# Patient Record
Sex: Male | Born: 1980 | Race: Black or African American | Hispanic: No | Marital: Single | Smoking: Never smoker
Health system: Southern US, Community
[De-identification: ages and names within clinical notes are randomized; demographics above are authoritative.]

## PROBLEM LIST (undated history)

## (undated) DIAGNOSIS — E119 Type 2 diabetes mellitus without complications: Secondary | ICD-10-CM

## (undated) HISTORY — DX: Type 2 diabetes mellitus without complications: E11.9

---

## 2014-06-16 ENCOUNTER — Emergency Department (HOSPITAL_COMMUNITY)
Admission: EM | Admit: 2014-06-16 | Discharge: 2014-06-16 | Disposition: A | Discharge status: Home / Self Care | Payer: Medicaid Other | Attending: Emergency Medicine | Admitting: Emergency Medicine

## 2014-06-16 ENCOUNTER — Encounter (HOSPITAL_COMMUNITY): Payer: Self-pay | Admitting: Emergency Medicine

## 2014-06-16 DIAGNOSIS — M545 Low back pain: Secondary | ICD-10-CM | POA: Diagnosis present

## 2014-06-16 DIAGNOSIS — M5442 Lumbago with sciatica, left side: Secondary | ICD-10-CM

## 2014-06-16 MED ORDER — CYCLOBENZAPRINE HCL 10 MG PO TABS: 10.0000 mg | ORAL_TABLET | Freq: Every day | ORAL | Status: DC

## 2014-06-16 MED ORDER — IBUPROFEN 800 MG PO TABS: 800.0000 mg | ORAL_TABLET | Freq: Three times a day (TID) | ORAL | Status: DC

## 2014-06-16 NOTE — ED Notes (Signed)
Pt c/o lower back pain with radiation down both legs, worse in left x1 week. Denies trauma. Steady gait

## 2014-06-16 NOTE — ED Provider Notes (Signed)
CSN: [id number]     Arrival date & time 06/16/14  1930 History  This chart was scribed for Elpidio AnisShari Jazzlin Clements, PA-C with Purvis SheffieldForrest Harrison, MD by Tonye RoyaltyJoshua Chen, ED Scribe. This patient was seen in room WTR8/WTR8 and the patient's care was started at 8:26 PM.    Chief Complaint  Patient presents with  . Back Pain   The history is provided by the patient. No language interpreter was used.    HPI Comments: Steve Skinner is a 33 y.o. male who presents to the Emergency Department complaining of low back pain radiating to both legs, worse on the left, with onset 1 week ago. He denies particular exertion or injury, but notes he does play sports. He notes similar pain 1 year ago that resolved after exercises. He denies chronic medical problems. He states he has not taken any medication for his symptoms but has tried yoga which improved his pain. He denies incontinence, weakness in legs, or abdominal pain.  History reviewed. No pertinent past medical history. History reviewed. No pertinent past surgical history. No family history on file. History  Substance Use Topics  . Smoking status: Never Smoker   . Smokeless tobacco: Not on file  . Alcohol Use: Yes     Comment: occ    Review of Systems  Constitutional: Negative for fever.  Gastrointestinal: Negative for abdominal pain.  Genitourinary: Negative for enuresis.  Musculoskeletal: Positive for back pain.  Neurological: Negative for weakness and numbness.       Denies incontinence      Allergies  Review of patient's allergies indicates no known allergies.  Home Medications   Prior to Admission medications   Not on File   BP 145/87 mmHg  Pulse 75  Temp(Src) 98.1 F (36.7 C) (Oral)  Resp 16  Ht 5\' 11"  (1.803 m)  Wt 170 lb (77.111 kg)  BMI 23.72 kg/m2  SpO2 97% Physical Exam  Constitutional: He is oriented to person, place, and time. He appears well-developed and well-nourished.  HENT:  Head: Normocephalic and atraumatic.  Eyes:  Conjunctivae are normal.  Cardiovascular:  Distal pulses present in lower extremities  Pulmonary/Chest: Effort normal.  Musculoskeletal:  No reproducable lumbar back pain, no swelling Lower extrmities have full and equal strength, no swelling  Neurological: He is alert and oriented to person, place, and time.  Reflexes equal in lower extremities  Psychiatric: He has a normal mood and affect.  Nursing note and vitals reviewed.   ED Course  Procedures (including critical care time)  DIAGNOSTIC STUDIES: Oxygen Saturation is 97% on room air, normal by my interpretation.    COORDINATION OF CARE: 8:31 PM Discussed treatment plan with patient at beside, the patient agrees with the plan and has no further questions at this time.   Labs Review Labs Reviewed - No data to display  Imaging Review No results found.   EKG Interpretation None      MDM   Final diagnoses:  None    1. Low back pain  No neurologic deficits on exam. Well appearing patient with low back pain that is improving. Supportive care.  I personally performed the services described in this documentation, which was scribed in my presence. The recorded information has been reviewed and is accurate.    Arnoldo HookerShari A Annastasia Haskins, PA-C 06/16/14 2042  Purvis SheffieldForrest Harrison, MD 06/17/14 [phone]

## 2014-06-16 NOTE — Discharge Instructions (Signed)
Back Pain, Adult Back pain is very common. The pain often gets better over time. The cause of back pain is usually not dangerous. Most people can learn to manage their back pain on their own.  HOME CARE   Stay active. Start with short walks on flat ground if you can. Try to walk farther each day.  Do not sit, drive, or stand in one place for more than 30 minutes. Do not stay in bed.  Do not avoid exercise or work. Activity can help your back heal faster.  Be careful when you bend or lift an object. Bend at your knees, keep the object close to you, and do not twist.  Sleep on a firm mattress. Lie on your side, and bend your knees. If you lie on your back, put a pillow under your knees.  Only take medicines as told by your doctor.  Put ice on the injured area.  Put ice in a plastic bag.  Place a towel between your skin and the bag.  Leave the ice on for 15-20 minutes, 03-04 times a day for the first 2 to 3 days. After that, you can switch between ice and heat packs.  Ask your doctor about back exercises or massage.  Avoid feeling anxious or stressed. Find good ways to deal with stress, such as exercise. GET HELP RIGHT AWAY IF:   Your pain does not go away with rest or medicine.  Your pain does not go away in 1 week.  You have new problems.  You do not feel well.  The pain spreads into your legs.  You cannot control when you poop (bowel movement) or pee (urinate).  Your arms or legs feel weak or lose feeling (numbness).  You feel sick to your stomach (nauseous) or throw up (vomit).  You have belly (abdominal) pain.  You feel like you may pass out (faint). MAKE SURE YOU:   Understand these instructions.  Will watch your condition.  Will get help right away if you are not doing well or get worse. Document Released: 12/03/2007 Document Revised: 09/08/2011 Document Reviewed: 10/18/2013 [facility] Patient Information 2015 [place]Care, [place]. This information is not intended  to replace advice given to you by your health care provider. Make sure you discuss any questions you have with your health care provider. Cryotherapy Cryotherapy means treatment with cold. Ice or gel packs can be used to reduce both pain and swelling. Ice is the most helpful within the first 24 to 48 hours after an injury or flare-up from overusing a muscle or joint. Sprains, strains, spasms, burning pain, shooting pain, and aches can all be eased with ice. Ice can also be used when recovering from surgery. Ice is effective, has very few side effects, and is safe for most people to use. PRECAUTIONS  Ice is not a safe treatment option for people with:  Raynaud phenomenon. This is a condition affecting small blood vessels in the extremities. Exposure to cold may cause your problems to return.  Cold hypersensitivity. There are many forms of cold hypersensitivity, including:  Cold urticaria. Red, itchy hives appear on the skin when the tissues begin to warm after being iced.  Cold erythema. This is a red, itchy rash caused by exposure to cold.  Cold hemoglobinuria. Red blood cells break down when the tissues begin to warm after being iced. The hemoglobin that carry oxygen are passed into the urine because they cannot combine with blood proteins fast enough.  Numbness or altered sensitivity  in the area being iced. °If you have any of the following conditions, do not use ice until you have discussed cryotherapy with your caregiver: °· Heart conditions, such as arrhythmia, angina, or chronic heart disease. °· High blood pressure. °· Healing wounds or open skin in the area being iced. °· Current infections. °· Rheumatoid arthritis. °· Poor circulation. °· Diabetes. °Ice slows the blood flow in the region it is applied. This is beneficial when trying to stop inflamed tissues from spreading irritating chemicals to surrounding tissues. However, if you expose your skin to cold temperatures for too long or  without the proper protection, you can damage your skin or nerves. Watch for signs of skin damage due to cold. °HOME CARE INSTRUCTIONS °Follow these tips to use ice and cold packs safely. °· Place a dry or damp towel between the ice and skin. A damp towel will cool the skin more quickly, so you may need to shorten the time that the ice is used. °· For a more rapid response, add gentle compression to the ice. °· Ice for no more than 10 to 20 minutes at a time. The bonier the area you are icing, the less time it will take to get the benefits of ice. °· Check your skin after 5 minutes to make sure there are no signs of a poor response to cold or skin damage. °· Rest 20 minutes or more between uses. °· Once your skin is numb, you can end your treatment. You can test numbness by very lightly touching your skin. The touch should be so light that you do not see the skin dimple from the pressure of your fingertip. When using ice, most people will feel these normal sensations in this order: cold, burning, aching, and numbness. °· Do not use ice on someone who cannot communicate their responses to pain, such as small children or people with dementia. °HOW TO MAKE AN ICE PACK °Ice packs are the most common way to use ice therapy. Other methods include ice massage, ice baths, and cryosprays. Muscle creams that cause a cold, tingly feeling do not offer the same benefits that ice offers and should not be used as a substitute unless recommended by your caregiver. °To make an ice pack, do one of the following: °· Place crushed ice or a bag of frozen vegetables in a sealable plastic bag. Squeeze out the excess air. Place this bag inside another plastic bag. Slide the bag into a pillowcase or place a damp towel between your skin and the bag. °· Mix 3 parts water with 1 part rubbing alcohol. Freeze the mixture in a sealable plastic bag. When you remove the mixture from the freezer, it will be slushy. Squeeze out the excess air. Place  this bag inside another plastic bag. Slide the bag into a pillowcase or place a damp towel between your skin and the bag. °SEEK MEDICAL CARE IF: °· You develop white spots on your skin. This may give the skin a blotchy (mottled) appearance. °· Your skin turns blue or pale. °· Your skin becomes waxy or hard. °· Your swelling gets worse. °MAKE SURE YOU:  °· Understand these instructions. °· Will watch your condition. °· Will get help right away if you are not doing well or get worse. °Document Released: 02/10/2011 Document Revised: 10/31/2013 Document Reviewed: 02/10/2011 °ExitCare® Patient Information ©2015 [employer], LLC. This information is not intended to replace advice given to you by your health care provider. Make sure you discuss any   questions you have with your health care provider. Heat Therapy Heat therapy can help ease sore, stiff, injured, and tight muscles and joints. Heat relaxes your muscles, which may help ease your pain.  RISKS AND COMPLICATIONS If you have any of the following conditions, do not use heat therapy unless your health care provider has approved:  Poor circulation.  Healing wounds or scarred skin in the area being treated.  Diabetes, heart disease, or high blood pressure.  Not being able to feel (numbness) the area being treated.  Unusual swelling of the area being treated.  Active infections.  Blood clots.  Cancer.  Inability to communicate pain. This may include young children and people who have problems with their brain function (dementia).  Pregnancy. Heat therapy should only be used on old, pre-existing, or long-lasting (chronic) injuries. Do not use heat therapy on new injuries unless directed by your health care provider. HOW TO USE HEAT THERAPY There are several different kinds of heat therapy, including:  Moist heat pack.  Warm water bath.  Hot water bottle.  Electric heating pad.  Heated gel pack.  Heated wrap.  Electric heating  pad. Use the heat therapy method suggested by your health care provider. Follow your health care provider's instructions on when and how to use heat therapy. GENERAL HEAT THERAPY RECOMMENDATIONS  Do not sleep while using heat therapy. Only use heat therapy while you are awake.  Your skin may turn pink while using heat therapy. Do not use heat therapy if your skin turns red.  Do not use heat therapy if you have new pain.  High heat or long exposure to heat can cause burns. Be careful when using heat therapy to avoid burning your skin.  Do not use heat therapy on areas of your skin that are already irritated, such as with a rash or sunburn. SEEK MEDICAL CARE IF:  You have blisters, redness, swelling, or numbness.  You have new pain.  Your pain is worse. MAKE SURE YOU:  Understand these instructions.  Will watch your condition.  Will get help right away if you are not doing well or get worse. Document Released: 09/08/2011 Document Revised: 10/31/2013 Document Reviewed: 08/09/2013 [facility] IncExitCare Patient Information 2015 [place]ExitCare, [place]LLC. This information is not intended to replace advice given to you by your health care provider. Make sure you discuss any questions you have with your health care provider.

## 2017-10-23 ENCOUNTER — Emergency Department: Payer: Self-pay

## 2017-10-23 ENCOUNTER — Other Ambulatory Visit: Payer: Self-pay

## 2017-10-23 ENCOUNTER — Emergency Department
Admission: EM | Admit: 2017-10-23 | Discharge: 2017-10-23 | Disposition: A | Discharge status: Home / Self Care | Payer: Self-pay | Attending: Emergency Medicine | Admitting: Emergency Medicine

## 2017-10-23 ENCOUNTER — Encounter: Payer: Self-pay | Admitting: Emergency Medicine

## 2017-10-23 DIAGNOSIS — T300 Burn of unspecified body region, unspecified degree: Secondary | ICD-10-CM

## 2017-10-23 DIAGNOSIS — Y939 Activity, unspecified: Secondary | ICD-10-CM | POA: Insufficient documentation

## 2017-10-23 DIAGNOSIS — X088XXA Exposure to other specified smoke, fire and flames, initial encounter: Secondary | ICD-10-CM | POA: Insufficient documentation

## 2017-10-23 DIAGNOSIS — S39012A Strain of muscle, fascia and tendon of lower back, initial encounter: Secondary | ICD-10-CM | POA: Insufficient documentation

## 2017-10-23 DIAGNOSIS — Y929 Unspecified place or not applicable: Secondary | ICD-10-CM | POA: Insufficient documentation

## 2017-10-23 DIAGNOSIS — W2211XA Striking against or struck by driver side automobile airbag, initial encounter: Secondary | ICD-10-CM | POA: Insufficient documentation

## 2017-10-23 DIAGNOSIS — T22112A Burn of first degree of left forearm, initial encounter: Secondary | ICD-10-CM | POA: Insufficient documentation

## 2017-10-23 DIAGNOSIS — Y999 Unspecified external cause status: Secondary | ICD-10-CM | POA: Insufficient documentation

## 2017-10-23 DIAGNOSIS — S161XXA Strain of muscle, fascia and tendon at neck level, initial encounter: Secondary | ICD-10-CM | POA: Insufficient documentation

## 2017-10-23 MED ORDER — MELOXICAM 15 MG PO TABS: 15.0000 mg | ORAL_TABLET | Freq: Every day | ORAL | 2 refills | Status: AC

## 2017-10-23 MED ORDER — BACLOFEN 10 MG PO TABS: 10.0000 mg | ORAL_TABLET | Freq: Every day | ORAL | 1 refills | Status: AC
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
## 2017-10-23 NOTE — Discharge Instructions (Signed)
Follow-up with your regular doctor or Dr. Hyacinth MeekerMiller if you are not better in 7 to 10 days.  Use medication as prescribed.  Apply ice to any areas that hurt for the next 3 days.  If you become worse please return to the emergency department
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
## 2024-03-30 ENCOUNTER — Ambulatory Visit: Payer: Self-pay

## 2024-03-30 VITALS — BP 113/79 | HR 73 | Temp 97.6°F | Ht 71.0 in | Wt 169.1 lb

## 2024-03-30 DIAGNOSIS — E785 Hyperlipidemia, unspecified: Secondary | ICD-10-CM | POA: Insufficient documentation

## 2024-03-30 DIAGNOSIS — M25512 Pain in left shoulder: Secondary | ICD-10-CM | POA: Diagnosis not present

## 2024-03-30 DIAGNOSIS — R7303 Prediabetes: Secondary | ICD-10-CM | POA: Insufficient documentation

## 2024-03-30 DIAGNOSIS — G4700 Insomnia, unspecified: Secondary | ICD-10-CM | POA: Insufficient documentation

## 2024-03-30 DIAGNOSIS — Z13228 Encounter for screening for other metabolic disorders: Secondary | ICD-10-CM

## 2024-03-30 DIAGNOSIS — Z1321 Encounter for screening for nutritional disorder: Secondary | ICD-10-CM

## 2024-03-30 DIAGNOSIS — Z1159 Encounter for screening for other viral diseases: Secondary | ICD-10-CM

## 2024-03-30 DIAGNOSIS — Z1329 Encounter for screening for other suspected endocrine disorder: Secondary | ICD-10-CM

## 2024-03-30 DIAGNOSIS — Z130 Encounter for screening for diseases of the blood and blood-forming organs and certain disorders involving the immune mechanism: Secondary | ICD-10-CM

## 2024-03-30 MED ORDER — DICLOFENAC SODIUM 1 % EX GEL: 4.0000 g | Freq: Four times a day (QID) | CUTANEOUS | 1 refills | Status: AC | PRN

## 2024-03-30 MED ORDER — ROSUVASTATIN CALCIUM 5 MG PO TABS: 5.0000 mg | ORAL_TABLET | Freq: Every day | ORAL | 1 refills | Status: DC

## 2024-03-30 NOTE — Assessment & Plan Note (Signed)
 Difficulty sleeping with no specific cause. Interested in natural supplements. - Recommended melatonin for sleep initiation. - Recommended magnesium glycinate for sleep quality. - Suggested combination tablet of melatonin and magnesium over the counter.

## 2024-03-30 NOTE — Assessment & Plan Note (Signed)
 Chronic pain likely from sleeping position. Possible muscle strain or joint issue. - Referred to physical therapy with [facility] in [place]. - Prescribed Voltaren gel for pain relief at [facility]. - Consider imaging if no improvement with physical therapy.

## 2024-03-30 NOTE — Patient Instructions (Signed)
 VISIT SUMMARY: Today, we discussed your elevated cholesterol, shoulder pain, blood sugar levels, sleep issues, and blood pressure. We reviewed your recent lab results and talked about your current lifestyle and preferences for treatment.  YOUR PLAN: -HYPERCHOLESTEROLEMIA: Hypercholesterolemia means having high levels of cholesterol in your blood, which can increase the risk of heart disease. We have prescribed rosuvastatin 5 mg daily to help lower your cholesterol. We discussed potential side effects and the possibility of switching to atorvastatin or adding Zetia if needed. You should also consider taking CoQ10 and red yeast rice supplements. We will re-evaluate your cholesterol levels in three months with fasting blood work.  -PREDIABETES: Prediabetes means your blood sugar levels are higher than normal but not high enough to be classified as diabetes. We will monitor your A1c levels during your cholesterol checks. It's important to maintain a healthy diet and regular exercise to help control your blood sugar levels.  -CHRONIC RIGHT SHOULDER PAIN: Chronic right shoulder pain can be due to muscle strain or joint issues, possibly from your [place] training or sleeping position. We have referred you to physical therapy and prescribed Voltaren gel for pain relief. If there is no improvement with physical therapy, we may consider imaging to further investigate the cause.  -INSOMNIA: Insomnia is difficulty sleeping. We recommend trying melatonin to help you fall asleep and magnesium glycinate to improve sleep quality. You can find a combination tablet of melatonin and magnesium glycinate over the counter.  INSTRUCTIONS: Please follow up in three months for a cholesterol re-evaluation with fasting blood work. Continue monitoring your blood sugar levels and maintain your healthy lifestyle. Attend physical therapy sessions for your shoulder pain and use Voltaren gel as prescribed. If your sleep issues  persist, consider trying the recommended supplements.  If you have any problems before your next visit feel free to message me via MyChart (minor issues or questions) or call the office, otherwise you may reach out to schedule an office visit.  Thank you! Saddie Sacks, PA-C

## 2024-03-30 NOTE — Assessment & Plan Note (Signed)
 Chronic hypercholesterolemia. Elevated cholesterol for three years per lab results he had performed at his job at [employer] - Prescribed rosuvastatin 5 mg daily for 30 days with refills at [facility]. - Discussed statin side effects and plan to switch to atorvastatin if needed. - Consider Zetia if statins not tolerated. - Discussed potential medication discontinuation after a year if improved. - Recommended CoQ10 and red yeast rice supplements. - Plan cholesterol re-evaluation in three months with fasting blood work.

## 2024-03-30 NOTE — Progress Notes (Signed)
 New Patient Office Visit  Subjective    Patient ID: Steve Skinner, male    DOB: 15-Jan-1981  Age: 44 y.o. MRN: [id number]  CC:  Chief Complaint  Patient presents with   New Patient (Initial Visit)   History of Present Illness   Steve Skinner is a 43 year old male who presents with elevated cholesterol and shoulder pain.  Hyperlipidemia - Elevated cholesterol for approximately three years, confirmed by recent laboratory results - Maintains a healthy diet and regular exercise routine - Not taking any daily medications for cholesterol management - Prefers a holistic approach, including yoga and herbal supplements  Shoulder pain - Aching pain in the shoulder since June - Pain worsens with rotation and is more pronounced at night - Suspects pain may be related to intense [place] training or sleeping position - No snapping, cracking, or popping sounds - Has not used over-the-counter medications for pain relief - More concerned about the etiology of the pain than pain relief  Glycemic status - Family history of diabetes (mother) - Recent laboratory results: fasting blood sugar within normal range - Hemoglobin A1c barely within the prediabetic range  Sleep disturbance - Poor sleep quality at night without a clear etiology - Has not tried melatonin or other sleep aids   Physical activity - Physically active, averaging 10,000 steps per day - Motivated by employer's incentive program      Screenings:  Colon Cancer: N/A - due next year  Lung Cancer: N/A Breast Cancer: N/A Diabetes: Checking A1c with labs  HLD: Checking lipid panel with labs The ASCVD Risk score (Arnett DK, et al., 2019) failed to calculate for the following reasons:   Cannot find a previous HDL lab   Cannot find a previous total cholesterol lab   Outpatient Encounter Medications as of 03/30/2024  Medication Sig   diclofenac Sodium (VOLTAREN) 1 % GEL Apply 4 g topically 4 (four) times daily as needed.    rosuvastatin (CRESTOR) 5 MG tablet Take 1 tablet (5 mg total) by mouth daily.   No facility-administered encounter medications on file as of 03/30/2024.    Past Medical History:  Diagnosis Date   Diabetes mellitus without complication (HCC)     History reviewed. No pertinent surgical history.  Family History  Problem Relation Age of Onset   Hypertension Mother    Hyperlipidemia Mother    Diabetes Mother     Social History   Socioeconomic History   Marital status: Single    Spouse name: Not on file   Number of children: 1   Years of education: Not on file   Highest education level: Bachelor's degree (e.g., BA, AB, BS)  Occupational History   Occupation: Community education officer: [employer]  Tobacco Use   Smoking status: Never   Smokeless tobacco: Never  Substance and Sexual Activity   Alcohol use: Never    Comment: occ   Drug use: No   Sexual activity: Yes    Birth control/protection: Pill  Other Topics Concern   Not on file  Social History Narrative   Not on file   Social Drivers of Health   Financial Resource Strain: Not on file  Food Insecurity: No Food Insecurity (03/30/2024)   Hunger Vital Sign    Worried About Running Out of Food in the Last Year: Never true    Ran Out of Food in the Last Year: Never true  Transportation Needs: Unmet Transportation Needs (03/30/2024)   PRAPARE - Transportation  Lack of Transportation (Medical): Yes    Lack of Transportation (Non-Medical): Yes  Physical Activity: Not on file  Stress: Not on file  Social Connections: Not on file  Intimate Partner Violence: Not At Risk (03/30/2024)   Humiliation, Afraid, Rape, and Kick questionnaire    Fear of Current or Ex-Partner: No    Emotionally Abused: No    Physically Abused: No    Sexually Abused: No    ROS  Per HPI      Objective    BP 113/79   Pulse 73   Temp 97.6 F (36.4 C) (Oral)   Ht 5' 11 (1.803 m)   Wt 169 lb 1.9 oz (76.7 kg)   SpO2 97%   BMI 23.59 kg/m    Physical Exam Constitutional:      General: He is not in acute distress.    Appearance: Normal appearance.  Cardiovascular:     Rate and Rhythm: Normal rate and regular rhythm.     Heart sounds: Normal heart sounds. No murmur heard.    No friction rub. No gallop.  Pulmonary:     Effort: Pulmonary effort is normal. No respiratory distress.     Breath sounds: Normal breath sounds.  Musculoskeletal:        General: No swelling.     Right shoulder: Normal.     Left shoulder: Bony tenderness present. No swelling, deformity, effusion or crepitus. Decreased range of motion.  Skin:    General: Skin is warm and dry.  Neurological:     General: No focal deficit present.     Mental Status: He is alert.  Psychiatric:        Mood and Affect: Mood normal.        Behavior: Behavior normal.        Thought Content: Thought content normal.          Assessment & Plan:   Acute pain of left shoulder Assessment & Plan: Chronic pain likely from sleeping position. Possible muscle strain or joint issue. - Referred to physical therapy with [facility] in [place]. - Prescribed Voltaren gel for pain relief at [facility]. - Consider imaging if no improvement with physical therapy.  Orders: -     Ambulatory referral to Physical Therapy  Hyperlipidemia LDL goal <100 Assessment & Plan: Chronic hypercholesterolemia. Elevated cholesterol for three years per lab results he had performed at his job at [employer] - Prescribed rosuvastatin 5 mg daily for 30 days with refills at [facility]. - Discussed statin side effects and plan to switch to atorvastatin if needed. - Consider Zetia if statins not tolerated. - Discussed potential medication discontinuation after a year if improved. - Recommended CoQ10 and red yeast rice supplements. - Plan cholesterol re-evaluation in three months with fasting blood work.   Prediabetes Assessment & Plan: Last A1c 5.9. Emphasized continued diet and exercise  for tight A1c control. Will recheck in 3 months.   Insomnia, unspecified type Assessment & Plan: Difficulty sleeping with no specific cause. Interested in natural supplements. - Recommended melatonin for sleep initiation. - Recommended magnesium glycinate for sleep quality. - Suggested combination tablet of melatonin and magnesium over the counter.    Other orders -     Rosuvastatin Calcium; Take 1 tablet (5 mg total) by mouth daily.  Dispense: 30 tablet; Refill: 1 -     Diclofenac Sodium; Apply 4 g topically 4 (four) times daily as needed.  Dispense: 50 g; Refill: 1   Return in about 3 months (  around 06/30/2024) for HLD, right shouler pain.   Saddie JULIANNA Sacks, PA-C

## 2024-03-30 NOTE — Assessment & Plan Note (Signed)
 Last A1c 5.9. Emphasized continued diet and exercise for tight A1c control. Will recheck in 3 months.

## 2024-04-25 MED ORDER — ROSUVASTATIN CALCIUM 5 MG PO TABS: 5.0000 mg | ORAL_TABLET | Freq: Every day | ORAL | 1 refills | Status: DC

## 2024-05-23 MED ORDER — ROSUVASTATIN CALCIUM 5 MG PO TABS: 5.0000 mg | ORAL_TABLET | Freq: Every day | ORAL | 1 refills | Status: DC

## 2024-06-01 ENCOUNTER — Telehealth: Payer: Self-pay | Admitting: *Deleted

## 2024-06-01 NOTE — Telephone Encounter (Signed)
 Tried to contact pt to ask about his Rx for rosuvastatin .  We received a refill request from [facility] and it looks like it was sent to [facility] on 05/23/24 for a 90 day supply with one refill.  Wanting to clarify which pharmacy he is using because [facility] is not listed.  Also when I tried to call the pt it would not go through so I am going to send a mychart message to the pt to see which pharmacy and to verify the number listed.

## 2024-06-03 ENCOUNTER — Other Ambulatory Visit: Payer: Self-pay | Admitting: *Deleted

## 2024-06-03 MED ORDER — ROSUVASTATIN CALCIUM 5 MG PO TABS: 5.0000 mg | ORAL_TABLET | Freq: Every day | ORAL | 1 refills | Status: AC

## 2024-06-03 NOTE — Telephone Encounter (Signed)
 Contacted [facility] to see if pt had picked up his rosuvastatin  and she stated he had not, received request from [facility] as well I will send it to them to fill since he has not picked it up from [place].

## 2024-06-03 NOTE — Telephone Encounter (Signed)
 Received request from [facility] and it was originally sent to [facility], confirmed it had not been picked up and reordered it to sent to [facility]

## 2024-06-27 ENCOUNTER — Other Ambulatory Visit

## 2024-06-27 DIAGNOSIS — Z1321 Encounter for screening for nutritional disorder: Secondary | ICD-10-CM

## 2024-06-27 DIAGNOSIS — Z1159 Encounter for screening for other viral diseases: Secondary | ICD-10-CM

## 2024-06-27 DIAGNOSIS — R7303 Prediabetes: Secondary | ICD-10-CM

## 2024-06-27 DIAGNOSIS — E785 Hyperlipidemia, unspecified: Secondary | ICD-10-CM

## 2024-06-28 ENCOUNTER — Ambulatory Visit: Payer: Self-pay

## 2024-06-28 LAB — CBC WITH DIFFERENTIAL/PLATELET
Basophils Absolute: 0 x10E3/uL (ref 0.0–0.2)
Basos: 0 %
EOS (ABSOLUTE): 0.1 x10E3/uL (ref 0.0–0.4)
Eos: 2 %
Hematocrit: 42.8 % (ref 37.5–51.0)
Hemoglobin: 13.7 g/dL (ref 13.0–17.7)
Immature Grans (Abs): 0 x10E3/uL (ref 0.0–0.1)
Immature Granulocytes: 0 %
Lymphocytes Absolute: 1.4 x10E3/uL (ref 0.7–3.1)
Lymphs: 26 %
MCH: 27.7 pg (ref 26.6–33.0)
MCHC: 32 g/dL (ref 31.5–35.7)
MCV: 87 fL (ref 79–97)
Monocytes Absolute: 0.6 x10E3/uL (ref 0.1–0.9)
Monocytes: 11 %
Neutrophils Absolute: 3.2 x10E3/uL (ref 1.4–7.0)
Neutrophils: 61 %
Platelets: 196 x10E3/uL (ref 150–450)
RBC: 4.94 x10E6/uL (ref 4.14–5.80)
RDW: 13.1 % (ref 11.6–15.4)
WBC: 5.4 x10E3/uL (ref 3.4–10.8)

## 2024-06-28 LAB — COMPREHENSIVE METABOLIC PANEL WITH GFR
ALT: 40 IU/L (ref 0–44)
AST: 34 IU/L (ref 0–40)
Albumin: 4.3 g/dL (ref 4.1–5.1)
Alkaline Phosphatase: 59 IU/L (ref 47–123)
BUN/Creatinine Ratio: 15 (ref 9–20)
BUN: 15 mg/dL (ref 6–24)
Bilirubin Total: 0.4 mg/dL (ref 0.0–1.2)
CO2: 24 mmol/L (ref 20–29)
Calcium: 9.3 mg/dL (ref 8.7–10.2)
Chloride: 105 mmol/L (ref 96–106)
Creatinine, Ser: 0.97 mg/dL (ref 0.76–1.27)
Globulin, Total: 2.2 g/dL (ref 1.5–4.5)
Glucose: 97 mg/dL (ref 70–99)
Potassium: 4.3 mmol/L (ref 3.5–5.2)
Sodium: 141 mmol/L (ref 134–144)
Total Protein: 6.5 g/dL (ref 6.0–8.5)
eGFR: 99 mL/min/1.73

## 2024-06-28 LAB — LIPOPROTEIN A (LPA): Lipoprotein (a): 61.5 nmol/L

## 2024-06-28 LAB — TSH: TSH: 2.79 u[IU]/mL (ref 0.450–4.500)

## 2024-06-28 LAB — LIPID PANEL
Chol/HDL Ratio: 2.7 ratio (ref 0.0–5.0)
Cholesterol, Total: 147 mg/dL (ref 100–199)
HDL: 55 mg/dL
LDL Chol Calc (NIH): 80 mg/dL (ref 0–99)
Triglycerides: 58 mg/dL (ref 0–149)
VLDL Cholesterol Cal: 12 mg/dL (ref 5–40)

## 2024-06-28 LAB — HIV ANTIBODY (ROUTINE TESTING W REFLEX): HIV Screen 4th Generation wRfx: NONREACTIVE

## 2024-06-28 LAB — VITAMIN D 25 HYDROXY (VIT D DEFICIENCY, FRACTURES): Vit D, 25-Hydroxy: 13.2 ng/mL — ABNORMAL LOW (ref 30.0–100.0)

## 2024-06-28 LAB — HEMOGLOBIN A1C
Est. average glucose Bld gHb Est-mCnc: 126 mg/dL
Hgb A1c MFr Bld: 6 % — ABNORMAL HIGH (ref 4.8–5.6)

## 2024-06-28 LAB — HEPATITIS C ANTIBODY: Hep C Virus Ab: NONREACTIVE

## 2024-06-28 LAB — APOLIPOPROTEIN B: Apolipoprotein B: 72 mg/dL

## 2024-07-01 ENCOUNTER — Ambulatory Visit

## 2024-07-01 VITALS — BP 135/79 | HR 83 | Temp 98.6°F | Ht 71.0 in | Wt 174.1 lb

## 2024-07-01 DIAGNOSIS — G8929 Other chronic pain: Secondary | ICD-10-CM | POA: Diagnosis not present

## 2024-07-01 DIAGNOSIS — E785 Hyperlipidemia, unspecified: Secondary | ICD-10-CM | POA: Diagnosis not present

## 2024-07-01 DIAGNOSIS — M25512 Pain in left shoulder: Secondary | ICD-10-CM | POA: Diagnosis not present

## 2024-07-01 DIAGNOSIS — R7303 Prediabetes: Secondary | ICD-10-CM

## 2024-07-01 DIAGNOSIS — E559 Vitamin D deficiency, unspecified: Secondary | ICD-10-CM

## 2024-07-01 MED ORDER — CHOLECALCIFEROL 1.25 MG (50000 UT) PO TABS: 50000.0000 [IU] | ORAL_TABLET | ORAL | 0 refills | Status: AC

## 2024-07-01 NOTE — Patient Instructions (Signed)
 VISIT SUMMARY: During your visit, we addressed your shoulder pain, elevated A1c levels, and vitamin D deficiency. We also reviewed your cholesterol management and general health maintenance.  YOUR PLAN: RIGHT SHOULDER PAIN: You have persistent shoulder pain that worsens with certain movements and at night. -You have been referred to orthopedics for further evaluation and management. -Please use diclofenac  gel daily as advised. -Avoid exercises that cause tearing pain. -Based on your physical exam, I suspect a rotator cuff injury. You would like to wait and see if things get better on their own. Please keep me updated and let me know if you would like to move forward with imaging and referral to orthopedist.   PREDIABETES: Your A1c level is 6.0, which indicates prediabetes. You have a family history of diabetes. -We will recheck your A1c in 6 months. -Follow the dietary guidance provided to reduce carbohydrate and sugar intake. -Increase your water intake.  VITAMIN D DEFICIENCY: Your vitamin D level is low at 13, which can affect bone health and energy. -Take vitamin D 50,000 units once weekly for 12 weeks. -After 12 weeks, switch to an over-the-counter vitamin D supplement daily.   HYPERLIPIDEMIA: Your cholesterol levels are well controlled with your current medication. -Continue taking your current cholesterol medication.  GENERAL HEALTH MAINTENANCE: Your routine screenings and lab work are up to date. -Continue with routine health maintenance and screenings.  If you have any problems before your next visit feel free to message me via MyChart (minor issues or questions) or call the office, otherwise you may reach out to schedule an office visit.  Thank you! Saddie Sacks, PA-C

## 2024-07-04 NOTE — Progress Notes (Signed)
 "  Established Patient Office Visit  Subjective   Patient ID: Steve Skinner, male    DOB: 1981-03-13  Age: 44 y.o. MRN: [id number]  Chief Complaint  Patient presents with   Hyperlipidemia    HPI  History of Present Illness  Steve Skinner is a 44 year old male who presents for a follow up and to discuss recent lab results.    Left Shoulder pain - Persistent pain localized to the shoulder - Described as a tearing sensation during certain exercises, such as pushing his arm against the wall - Pain absent during most daily activities except when putting on a jacket or similar movements - Pain worsens at night, especially when lying on the affected side - Adjusted sleeping position to right side to avoid pain - Continues to exercise but avoids activities that exacerbate pain, such as pull-ups and dips; continues push-ups - Inconsistent use of diclofenac  gel with uncertain effectiveness - Was originally going to PT for this but stopped due to some of the exercises causing worsening pain  - Patient would like to avoid imaging and medication if possible   Elevated hemoglobin a1c - Current A1c is 6.0 - Attributes elevation to dietary habits during the holidays - Concerned about personal risk due to maternal history of diabetes - Does not consume sugary drinks and is mindful of diet - Interested in dietary modifications to manage A1c  Vitamin d  deficiency - Recent laboratory result shows vitamin D  level at 13 - No symptoms of low energy or fatigue          ROS Per HPI.    Objective:     BP 135/79   Pulse 83   Temp 98.6 F (37 C) (Oral)   Ht 5' 11 (1.803 m)   Wt 174 lb 1.9 oz (79 kg)   SpO2 97%   BMI 24.28 kg/m    Physical Exam Constitutional:      General: He is not in acute distress.    Appearance: Normal appearance.  Cardiovascular:     Rate and Rhythm: Normal rate and regular rhythm.     Heart sounds: Normal heart sounds. No murmur heard.    No friction  rub. No gallop.  Pulmonary:     Effort: Pulmonary effort is normal. No respiratory distress.     Breath sounds: Normal breath sounds.  Musculoskeletal:        General: No swelling.     Right shoulder: Normal.     Left shoulder: No deformity, effusion, bony tenderness or crepitus.     Comments: Left shoulder: +Neer sign and + Hawkins test  ROM normal but with pain   Skin:    General: Skin is warm and dry.  Neurological:     General: No focal deficit present.     Mental Status: He is alert.  Psychiatric:        Mood and Affect: Mood normal.        Behavior: Behavior normal.        Thought Content: Thought content normal.      No results found for any visits on 07/01/24.  Last CBC Lab Results  Component Value Date   WBC 5.4 06/27/2024   HGB 13.7 06/27/2024   HCT 42.8 06/27/2024   MCV 87 06/27/2024   MCH 27.7 06/27/2024   RDW 13.1 06/27/2024   PLT 196 06/27/2024   Last metabolic panel Lab Results  Component Value Date   GLUCOSE 97 06/27/2024   NA 141 06/27/2024  K 4.3 06/27/2024   CL 105 06/27/2024   CO2 24 06/27/2024   BUN 15 06/27/2024   CREATININE 0.97 06/27/2024   EGFR 99 06/27/2024   CALCIUM  9.3 06/27/2024   PROT 6.5 06/27/2024   ALBUMIN 4.3 06/27/2024   LABGLOB 2.2 06/27/2024   BILITOT 0.4 06/27/2024   ALKPHOS 59 06/27/2024   AST 34 06/27/2024   ALT 40 06/27/2024   Last lipids Lab Results  Component Value Date   CHOL 147 06/27/2024   HDL 55 06/27/2024   LDLCALC 80 06/27/2024   TRIG 58 06/27/2024   CHOLHDL 2.7 06/27/2024   Last hemoglobin A1c Lab Results  Component Value Date   HGBA1C 6.0 (H) 06/27/2024   Last thyroid functions Lab Results  Component Value Date   TSH 2.790 06/27/2024   Last vitamin D  Lab Results  Component Value Date   VD25OH 13.2 (L) 06/27/2024      The 10-year ASCVD risk score (Arnett DK, et al., 2019) is: 3.8%    Assessment & Plan:   Vitamin D  deficiency -     Cholecalciferol ; Take 50,000 Units by mouth once  a week.  Dispense: 12 tablet; Refill: 0  Prediabetes Assessment & Plan: A1c increased to 6.0. Family history noted. Current A1c below diabetes threshold. - Will recheck A1c in 6 months. - Provided dietary guidance to reduce carbohydrate and sugar intake. - Encouraged increased water intake.   Chronic left shoulder pain Assessment & Plan: Persistent pain, worsened by movement, suggests possible rotator cuff or SLAP injury. Pain not improving, requires further evaluation. Patient expressed that he would like to avoid imaging and oral medication. Offered to order x-ray, declined at this time but will think about it and let me know if he decides he wants to do this. Also offered oral anti-inflammatories, Declined but will continue using topical Voltaren .  - If pain persists, patient would most definitely benefit from imaging and referral to orthopedics  - Discussed that physical exam findings are highly suggestive of rotator cuff tear. Risks and benefits of treatment plan and deferment of imaging/referral discussed.  - Advised use of diclofenac  gel daily. - Instructed to avoid exercises causing tearing pain. Patient verbalized understanding.    Hyperlipidemia LDL goal <100 Assessment & Plan: Cholesterol levels well controlled with current medication. ApoB and lipoprotein A levels normal. - Continue current cholesterol medication, rosuvastatin  5 mg daily     Return in about 6 months (around 12/29/2024) for Physical.    Steve JULIANNA Sacks, PA-C "

## 2024-07-04 NOTE — Assessment & Plan Note (Signed)
 Cholesterol levels well controlled with current medication. ApoB and lipoprotein A levels normal. - Continue current cholesterol medication, rosuvastatin  5 mg daily

## 2024-07-04 NOTE — Assessment & Plan Note (Signed)
 A1c increased to 6.0. Family history noted. Current A1c below diabetes threshold. - Will recheck A1c in 6 months. - Provided dietary guidance to reduce carbohydrate and sugar intake. - Encouraged increased water intake.

## 2024-07-04 NOTE — Assessment & Plan Note (Addendum)
 Persistent pain, worsened by movement, suggests possible rotator cuff or SLAP injury. Pain not improving, requires further evaluation. Patient expressed that he would like to avoid imaging and oral medication. Offered to order x-ray, declined at this time but will think about it and let me know if he decides he wants to do this. Also offered oral anti-inflammatories, Declined but will continue using topical Voltaren .  - If pain persists, patient would most definitely benefit from imaging and referral to orthopedics  - Discussed that physical exam findings are highly suggestive of rotator cuff tear. Risks and benefits of treatment plan and deferment of imaging/referral discussed.  - Advised use of diclofenac  gel daily. - Instructed to avoid exercises causing tearing pain. Patient verbalized understanding.
